# Patient Record
Sex: Female | Born: 1975 | Hispanic: No | Marital: Married | Smoking: Never smoker
Health system: Southern US, Community
[De-identification: ages and names within clinical notes are randomized; demographics above are authoritative.]

## PROBLEM LIST (undated history)

## (undated) DIAGNOSIS — Z8614 Personal history of Methicillin resistant Staphylococcus aureus infection: Secondary | ICD-10-CM

## (undated) DIAGNOSIS — R51 Headache: Secondary | ICD-10-CM

## (undated) DIAGNOSIS — R0989 Other specified symptoms and signs involving the circulatory and respiratory systems: Secondary | ICD-10-CM

## (undated) DIAGNOSIS — E039 Hypothyroidism, unspecified: Secondary | ICD-10-CM

## (undated) DIAGNOSIS — S90811A Abrasion, right foot, initial encounter: Secondary | ICD-10-CM

## (undated) DIAGNOSIS — R519 Headache, unspecified: Secondary | ICD-10-CM

## (undated) DIAGNOSIS — Z9109 Other allergy status, other than to drugs and biological substances: Secondary | ICD-10-CM

## (undated) DIAGNOSIS — N62 Hypertrophy of breast: Secondary | ICD-10-CM

## (undated) DIAGNOSIS — J45990 Exercise induced bronchospasm: Secondary | ICD-10-CM

## (undated) HISTORY — PX: FINGER GANGLION CYST EXCISION: SHX1636

## (undated) HISTORY — PX: TUBAL LIGATION: SHX77

## (undated) HISTORY — PX: ENDOMETRIAL ABLATION W/ NOVASURE: SUR434

---

## 2015-07-21 HISTORY — PX: INCONTINENCE SURGERY: SHX676

## 2015-07-21 HISTORY — PX: ANTERIOR AND POSTERIOR REPAIR: SHX1172

## 2016-06-19 DIAGNOSIS — Z8614 Personal history of Methicillin resistant Staphylococcus aureus infection: Secondary | ICD-10-CM

## 2016-06-19 HISTORY — DX: Personal history of Methicillin resistant Staphylococcus aureus infection: Z86.14

## 2017-05-20 DIAGNOSIS — N62 Hypertrophy of breast: Secondary | ICD-10-CM

## 2017-05-20 HISTORY — DX: Hypertrophy of breast: N62

## 2017-06-15 ENCOUNTER — Encounter (HOSPITAL_BASED_OUTPATIENT_CLINIC_OR_DEPARTMENT_OTHER): Payer: Self-pay | Admitting: *Deleted

## 2017-06-15 ENCOUNTER — Other Ambulatory Visit: Payer: Self-pay

## 2017-06-15 DIAGNOSIS — S90811A Abrasion, right foot, initial encounter: Secondary | ICD-10-CM

## 2017-06-15 DIAGNOSIS — R0989 Other specified symptoms and signs involving the circulatory and respiratory systems: Secondary | ICD-10-CM

## 2017-06-15 HISTORY — DX: Other specified symptoms and signs involving the circulatory and respiratory systems: R09.89

## 2017-06-15 HISTORY — DX: Abrasion, right foot, initial encounter: S90.811A

## 2017-06-17 ENCOUNTER — Ambulatory Visit: Payer: Self-pay | Admitting: Plastic Surgery

## 2017-06-22 ENCOUNTER — Other Ambulatory Visit: Payer: Self-pay

## 2017-06-22 ENCOUNTER — Ambulatory Visit (HOSPITAL_BASED_OUTPATIENT_CLINIC_OR_DEPARTMENT_OTHER): Payer: BLUE CROSS/BLUE SHIELD | Admitting: Certified Registered"

## 2017-06-22 ENCOUNTER — Encounter (HOSPITAL_BASED_OUTPATIENT_CLINIC_OR_DEPARTMENT_OTHER): Payer: Self-pay | Admitting: Certified Registered"

## 2017-06-22 ENCOUNTER — Ambulatory Visit (HOSPITAL_BASED_OUTPATIENT_CLINIC_OR_DEPARTMENT_OTHER)
Admission: RE | Admit: 2017-06-22 | Discharge: 2017-06-22 | Disposition: A | Discharge status: Home / Self Care | Payer: BLUE CROSS/BLUE SHIELD | Source: Ambulatory Visit | Attending: Plastic Surgery | Admitting: Plastic Surgery

## 2017-06-22 ENCOUNTER — Encounter (HOSPITAL_BASED_OUTPATIENT_CLINIC_OR_DEPARTMENT_OTHER)
Admission: RE | Disposition: A | Discharge status: Home / Self Care | Payer: Self-pay | Source: Ambulatory Visit | Attending: Plastic Surgery

## 2017-06-22 DIAGNOSIS — D241 Benign neoplasm of right breast: Secondary | ICD-10-CM | POA: Insufficient documentation

## 2017-06-22 DIAGNOSIS — N62 Hypertrophy of breast: Secondary | ICD-10-CM | POA: Diagnosis not present

## 2017-06-22 DIAGNOSIS — E039 Hypothyroidism, unspecified: Secondary | ICD-10-CM | POA: Diagnosis not present

## 2017-06-22 DIAGNOSIS — D242 Benign neoplasm of left breast: Secondary | ICD-10-CM | POA: Insufficient documentation

## 2017-06-22 DIAGNOSIS — Z6841 Body Mass Index (BMI) 40.0 and over, adult: Secondary | ICD-10-CM | POA: Diagnosis not present

## 2017-06-22 DIAGNOSIS — J3089 Other allergic rhinitis: Secondary | ICD-10-CM | POA: Diagnosis not present

## 2017-06-22 HISTORY — PX: BREAST REDUCTION SURGERY: SHX8

## 2017-06-22 HISTORY — DX: Abrasion, right foot, initial encounter: S90.811A

## 2017-06-22 HISTORY — DX: Other specified symptoms and signs involving the circulatory and respiratory systems: R09.89

## 2017-06-22 HISTORY — DX: Hypertrophy of breast: N62

## 2017-06-22 HISTORY — DX: Headache, unspecified: R51.9

## 2017-06-22 HISTORY — DX: Other allergy status, other than to drugs and biological substances: Z91.09

## 2017-06-22 HISTORY — DX: Personal history of Methicillin resistant Staphylococcus aureus infection: Z86.14

## 2017-06-22 HISTORY — DX: Exercise induced bronchospasm: J45.990

## 2017-06-22 HISTORY — DX: Hypothyroidism, unspecified: E03.9

## 2017-06-22 HISTORY — DX: Headache: R51

## 2017-06-22 SURGERY — MAMMOPLASTY, REDUCTION
Anesthesia: General | Site: Breast | Laterality: Bilateral

## 2017-06-22 MED ORDER — BUPIVACAINE LIPOSOME 1.3 % IJ SUSP
INTRAMUSCULAR | Status: DC | PRN
  Administered 2017-06-22: 50 mL

## 2017-06-22 MED ORDER — PROPOFOL 500 MG/50ML IV EMUL
INTRAVENOUS | Status: AC
  Filled 2017-06-22: qty 50

## 2017-06-22 MED ORDER — FENTANYL CITRATE (PF) 100 MCG/2ML IJ SOLN
50.0000 ug | INTRAMUSCULAR | Status: AC | PRN
  Administered 2017-06-22 (×2): 50 ug via INTRAVENOUS
  Administered 2017-06-22: 100 ug via INTRAVENOUS
  Administered 2017-06-22 (×3): 50 ug via INTRAVENOUS

## 2017-06-22 MED ORDER — ONDANSETRON HCL 4 MG/2ML IJ SOLN
INTRAMUSCULAR | Status: AC
  Filled 2017-06-22: qty 4

## 2017-06-22 MED ORDER — FENTANYL CITRATE (PF) 100 MCG/2ML IJ SOLN
INTRAMUSCULAR | Status: AC
  Filled 2017-06-22: qty 2

## 2017-06-22 MED ORDER — SCOPOLAMINE 1 MG/3DAYS TD PT72
1.0000 | MEDICATED_PATCH | Freq: Once | TRANSDERMAL | Status: DC | PRN
  Administered 2017-06-22: 1.5 mg via TRANSDERMAL

## 2017-06-22 MED ORDER — LIDOCAINE-EPINEPHRINE 1 %-1:100000 IJ SOLN
INTRAMUSCULAR | Status: AC
  Filled 2017-06-22: qty 1

## 2017-06-22 MED ORDER — DEXAMETHASONE SODIUM PHOSPHATE 10 MG/ML IJ SOLN
INTRAMUSCULAR | Status: AC
  Filled 2017-06-22: qty 1

## 2017-06-22 MED ORDER — MIDAZOLAM HCL 2 MG/2ML IJ SOLN
1.0000 mg | INTRAMUSCULAR | Status: DC | PRN
  Administered 2017-06-22: 2 mg via INTRAVENOUS

## 2017-06-22 MED ORDER — SODIUM CHLORIDE 0.9 % IJ SOLN
INTRAMUSCULAR | Status: DC | PRN
  Administered 2017-06-22: 60 mL

## 2017-06-22 MED ORDER — LACTATED RINGERS IV SOLN
INTRAVENOUS | Status: DC
  Administered 2017-06-22 (×3): via INTRAVENOUS

## 2017-06-22 MED ORDER — CHLORHEXIDINE GLUCONATE CLOTH 2 % EX PADS: 6.0000 | MEDICATED_PAD | Freq: Once | CUTANEOUS | Status: DC

## 2017-06-22 MED ORDER — LIDOCAINE HCL (CARDIAC) 20 MG/ML IV SOLN
INTRAVENOUS | Status: DC | PRN
  Administered 2017-06-22: 60 mg via INTRAVENOUS

## 2017-06-22 MED ORDER — BUPIVACAINE HCL (PF) 0.25 % IJ SOLN
INTRAMUSCULAR | Status: AC
  Filled 2017-06-22: qty 30

## 2017-06-22 MED ORDER — SUGAMMADEX SODIUM 200 MG/2ML IV SOLN
INTRAVENOUS | Status: DC | PRN
  Administered 2017-06-22: 250 mg via INTRAVENOUS

## 2017-06-22 MED ORDER — SCOPOLAMINE 1 MG/3DAYS TD PT72
MEDICATED_PATCH | TRANSDERMAL | Status: AC
  Filled 2017-06-22: qty 1

## 2017-06-22 MED ORDER — BACITRACIN ZINC 500 UNIT/GM EX OINT
TOPICAL_OINTMENT | CUTANEOUS | Status: AC
  Filled 2017-06-22: qty 28.35

## 2017-06-22 MED ORDER — ROCURONIUM BROMIDE 10 MG/ML (PF) SYRINGE
PREFILLED_SYRINGE | INTRAVENOUS | Status: AC
  Filled 2017-06-22: qty 10

## 2017-06-22 MED ORDER — DEXAMETHASONE SODIUM PHOSPHATE 4 MG/ML IJ SOLN
INTRAMUSCULAR | Status: DC | PRN
  Administered 2017-06-22: 10 mg via INTRAVENOUS

## 2017-06-22 MED ORDER — OXYCODONE HCL 5 MG/5ML PO SOLN: 5.0000 mg | Freq: Once | ORAL | Status: DC | PRN

## 2017-06-22 MED ORDER — HYDROMORPHONE HCL 1 MG/ML IJ SOLN
0.2500 mg | INTRAMUSCULAR | Status: DC | PRN
  Administered 2017-06-22 (×3): 0.5 mg via INTRAVENOUS

## 2017-06-22 MED ORDER — SUGAMMADEX SODIUM 500 MG/5ML IV SOLN
INTRAVENOUS | Status: AC
  Filled 2017-06-22: qty 5

## 2017-06-22 MED ORDER — OXYCODONE HCL 5 MG PO TABS: 5.0000 mg | ORAL_TABLET | Freq: Once | ORAL | Status: DC | PRN

## 2017-06-22 MED ORDER — BUPIVACAINE HCL (PF) 0.5 % IJ SOLN
INTRAMUSCULAR | Status: AC
  Filled 2017-06-22: qty 30

## 2017-06-22 MED ORDER — ONDANSETRON HCL 4 MG/2ML IJ SOLN: 4.0000 mg | Freq: Four times a day (QID) | INTRAMUSCULAR | Status: DC | PRN

## 2017-06-22 MED ORDER — HYDROMORPHONE HCL 1 MG/ML IJ SOLN
INTRAMUSCULAR | Status: AC
  Filled 2017-06-22: qty 0.5

## 2017-06-22 MED ORDER — ONDANSETRON HCL 4 MG/2ML IJ SOLN
INTRAMUSCULAR | Status: DC | PRN
  Administered 2017-06-22: 4 mg via INTRAVENOUS

## 2017-06-22 MED ORDER — SODIUM CHLORIDE 0.9 % IJ SOLN
INTRAMUSCULAR | Status: DC | PRN
  Administered 2017-06-22: 50 mL

## 2017-06-22 MED ORDER — BACITRACIN ZINC 500 UNIT/GM EX OINT
TOPICAL_OINTMENT | CUTANEOUS | Status: DC | PRN
  Administered 2017-06-22: 1 via TOPICAL

## 2017-06-22 MED ORDER — PROPOFOL 10 MG/ML IV BOLUS
INTRAVENOUS | Status: DC | PRN
  Administered 2017-06-22: 200 mg via INTRAVENOUS

## 2017-06-22 MED ORDER — CIPROFLOXACIN IN D5W 400 MG/200ML IV SOLN
400.0000 mg | INTRAVENOUS | Status: AC
  Administered 2017-06-22: 400 mg via INTRAVENOUS

## 2017-06-22 MED ORDER — ROCURONIUM BROMIDE 100 MG/10ML IV SOLN
INTRAVENOUS | Status: DC | PRN
  Administered 2017-06-22: 20 mg via INTRAVENOUS
  Administered 2017-06-22: 60 mg via INTRAVENOUS

## 2017-06-22 MED ORDER — MIDAZOLAM HCL 2 MG/2ML IJ SOLN
INTRAMUSCULAR | Status: AC
  Filled 2017-06-22: qty 2

## 2017-06-22 MED ORDER — CIPROFLOXACIN IN D5W 400 MG/200ML IV SOLN
INTRAVENOUS | Status: AC
  Filled 2017-06-22: qty 200

## 2017-06-22 MED ORDER — BUPIVACAINE LIPOSOME 1.3 % IJ SUSP
INTRAMUSCULAR | Status: AC
Start: 2017-06-22 — End: 2017-06-22
  Filled 2017-06-22: qty 20

## 2017-06-22 SURGICAL SUPPLY — 60 items
BAG DECANTER FOR FLEXI CONT (MISCELLANEOUS) ×3 IMPLANT
BENZOIN TINCTURE PRP APPL 2/3 (GAUZE/BANDAGES/DRESSINGS) ×6 IMPLANT
BLADE KNIFE PERSONA 10 (BLADE) ×12 IMPLANT
BLADE KNIFE PERSONA 15 (BLADE) ×9 IMPLANT
BNDG GAUZE ELAST 4 BULKY (GAUZE/BANDAGES/DRESSINGS) ×6 IMPLANT
CANISTER SUCT 1200ML W/VALVE (MISCELLANEOUS) ×3 IMPLANT
CAP BOUFFANT 24 BLUE NURSES (PROTECTIVE WEAR) ×3 IMPLANT
CLOSURE WOUND 1/2 X4 (GAUZE/BANDAGES/DRESSINGS) ×4
COVER BACK TABLE 60X90IN (DRAPES) ×3 IMPLANT
COVER MAYO STAND STRL (DRAPES) ×3 IMPLANT
DECANTER SPIKE VIAL GLASS SM (MISCELLANEOUS) ×6 IMPLANT
DRAIN CHANNEL 10F 3/8 F FF (DRAIN) ×6 IMPLANT
DRAPE LAPAROSCOPIC ABDOMINAL (DRAPES) IMPLANT
DRAPE U-SHAPE 76X120 STRL (DRAPES) ×6 IMPLANT
DRSG EMULSION OIL 3X3 NADH (GAUZE/BANDAGES/DRESSINGS) ×6 IMPLANT
DRSG PAD ABDOMINAL 8X10 ST (GAUZE/BANDAGES/DRESSINGS) ×6 IMPLANT
ELECT REM PT RETURN 9FT ADLT (ELECTROSURGICAL) ×3
ELECTRODE REM PT RTRN 9FT ADLT (ELECTROSURGICAL) ×1 IMPLANT
EVACUATOR SILICONE 100CC (DRAIN) ×6 IMPLANT
FILTER 7/8 IN (FILTER) IMPLANT
GAUZE SPONGE 4X4 12PLY STRL (GAUZE/BANDAGES/DRESSINGS) ×6 IMPLANT
GLOVE BIO SURGEON STRL SZ7 (GLOVE) ×3 IMPLANT
GOWN STRL REUS W/ TWL LRG LVL3 (GOWN DISPOSABLE) ×2 IMPLANT
GOWN STRL REUS W/TWL LRG LVL3 (GOWN DISPOSABLE) ×4
IV NS 250ML (IV SOLUTION) ×2
IV NS 250ML BAXH (IV SOLUTION) ×1 IMPLANT
NDL SAFETY ECLIPSE 18X1.5 (NEEDLE) ×1 IMPLANT
NEEDLE HYPO 18GX1.5 SHARP (NEEDLE) ×2
NEEDLE HYPO 25X1 1.5 SAFETY (NEEDLE) ×9 IMPLANT
NEEDLE SPNL 18GX3.5 QUINCKE PK (NEEDLE) ×3 IMPLANT
NS IRRIG 1000ML POUR BTL (IV SOLUTION) ×6 IMPLANT
PACK BASIN DAY SURGERY FS (CUSTOM PROCEDURE TRAY) ×3 IMPLANT
PIN SAFETY STERILE (MISCELLANEOUS) ×3 IMPLANT
SCRUB TECHNI CARE 4 OZ NO DYE (MISCELLANEOUS) ×3 IMPLANT
SLEEVE SCD COMPRESS KNEE MED (MISCELLANEOUS) ×3 IMPLANT
SPECIMEN JAR MEDIUM (MISCELLANEOUS) IMPLANT
SPECIMEN JAR X LARGE (MISCELLANEOUS) ×6 IMPLANT
SPONGE LAP 18X18 X RAY DECT (DISPOSABLE) ×9 IMPLANT
STAPLER VISISTAT 35W (STAPLE) ×6 IMPLANT
STRIP CLOSURE SKIN 1/2X4 (GAUZE/BANDAGES/DRESSINGS) ×8 IMPLANT
SUT ETHILON 3 0 PS 1 (SUTURE) ×3 IMPLANT
SUT MNCRL AB 3-0 PS2 18 (SUTURE) IMPLANT
SUT MNCRL AB 4-0 PS2 18 (SUTURE) ×6 IMPLANT
SUT MON AB 5-0 PS2 18 (SUTURE) ×6 IMPLANT
SUT PROLENE 2 0 CT2 30 (SUTURE) ×3 IMPLANT
SUT PROLENE 3 0 PS 1 (SUTURE) ×6 IMPLANT
SUT VLOC 90 P-14 23 (SUTURE) ×6 IMPLANT
SYR BULB IRRIGATION 50ML (SYRINGE) ×6 IMPLANT
SYR CONTROL 10ML LL (SYRINGE) ×6 IMPLANT
TAPE MEASURE VINYL STERILE (MISCELLANEOUS) ×3 IMPLANT
TOWEL OR 17X24 6PK STRL BLUE (TOWEL DISPOSABLE) ×9 IMPLANT
TOWEL OR NON WOVEN STRL DISP B (DISPOSABLE) IMPLANT
TRAY DSU PREP LF (CUSTOM PROCEDURE TRAY) ×3 IMPLANT
TRAY FOLEY BAG SILVER LF 14FR (SET/KITS/TRAYS/PACK) IMPLANT
TRAY FOLEY BAG SILVER LF 16FR (SET/KITS/TRAYS/PACK) ×3 IMPLANT
TUBE CONNECTING 20'X1/4 (TUBING) ×1
TUBE CONNECTING 20X1/4 (TUBING) ×2 IMPLANT
UNDERPAD 30X30 (UNDERPADS AND DIAPERS) ×6 IMPLANT
VAC PENCILS W/TUBING CLEAR (MISCELLANEOUS) ×3 IMPLANT
YANKAUER SUCT BULB TIP NO VENT (SUCTIONS) ×3 IMPLANT

## 2017-06-22 NOTE — Anesthesia Procedure Notes (Signed)
Procedure Name: Intubation Date/Time: 06/22/2017 7:52 AM Performed by: Signe Colt, CRNA Pre-anesthesia Checklist: Patient identified, Emergency Drugs available, Suction available and Patient being monitored Patient Re-evaluated:Patient Re-evaluated prior to induction Oxygen Delivery Method: Circle system utilized Preoxygenation: Pre-oxygenation with 100% oxygen Induction Type: IV induction Ventilation: Mask ventilation without difficulty Laryngoscope Size: Mac and 3 Tube type: Oral Number of attempts: 1 Airway Equipment and Method: Stylet and Oral airway Placement Confirmation: ETT inserted through vocal cords under direct vision,  positive ETCO2 and breath sounds checked- equal and bilateral Tube secured with: Tape Dental Injury: Teeth and Oropharynx as per pre-operative assessment

## 2017-06-22 NOTE — Discharge Instructions (Signed)
1. No lifting greater than 5 lbs with arms for 4 weeks. 2. Empty, strip, record and reactivate JP drains 3 times a day. 3. Percocet 5/325 mg tabs 1-2 tabs po q 4-6 hours prn pain- prescription given in office. 4. Duricef 1 tab po bid- prescription given in office. 5. Sterapred dose pack as directed- prescription given in office. 6. Follow-up appointment Friday in office.        JP Drain [employer] this sheet to all of your post-operative appointments while you have your drains.  Please measure your drains by CC's or ML's.  Make sure you drain and measure your JP Drains 2 or 3 times per day.  At the end of each day, add up totals for the left side and add up totals for the right side.    ( 9 am )     ( 3 pm )        ( 9 pm )                Date L  R  L  R  L  R  Total L/R                                                                                                                                                                                             Information for Discharge Teaching: EXPAREL (bupivacaine liposome injectable suspension)   Your surgeon gave you EXPAREL(bupivacaine) in your surgical incision to help control your pain after surgery.   EXPAREL is a local anesthetic that provides pain relief by numbing the tissue around the surgical site.  EXPAREL is designed to release pain medication over time and can control pain for up to 72 hours.  Depending on how you respond to EXPAREL, you may require less pain medication during your recovery.  Possible side effects:  Temporary loss of sensation or ability to move in the area where bupivacaine was injected.  Nausea, vomiting, constipation  Rarely, numbness and tingling in your mouth or lips, lightheadedness, or anxiety may occur.  Call your doctor right away if you think you may be experiencing any of these sensations, or if you have other questions regarding possible side effects.  Follow  all other discharge instructions given to you by your surgeon or nurse. Eat a healthy diet and drink plenty of water or other fluids.  If you return to the hospital for any reason within 96 hours following the administration of EXPAREL, please inform your health care providers.       Post Anesthesia Home Care Instructions  Activity: Get plenty of rest for  the remainder of the day. A responsible individual must stay with you for 24 hours following the procedure.  For the next 24 hours, DO NOT: -Drive a car -Paediatric nurse -Drink alcoholic beverages -Take any medication unless instructed by your physician -Make any legal decisions or sign important papers.  Meals: Start with liquid foods such as gelatin or soup. Progress to regular foods as tolerated. Avoid greasy, spicy, heavy foods. If nausea and/or vomiting occur, drink only clear liquids until the nausea and/or vomiting subsides. Call your physician if vomiting continues.  Special Instructions/Symptoms: Your throat may feel dry or sore from the anesthesia or the breathing tube placed in your throat during surgery. If this causes discomfort, gargle with warm salt water. The discomfort should disappear within 24 hours.  If you had a scopolamine patch placed behind your ear for the management of post- operative nausea and/or vomiting:  1. The medication in the patch is effective for 72 hours, after which it should be removed.  Wrap patch in a tissue and discard in the trash. Wash hands thoroughly with soap and water. 2. You may remove the patch earlier than 72 hours if you experience unpleasant side effects which may include dry mouth, dizziness or visual disturbances. 3. Avoid touching the patch. Wash your hands with soap and water after contact with the patch.

## 2017-06-22 NOTE — Anesthesia Postprocedure Evaluation (Signed)
Anesthesia Post Note  Patient: Loretta Joseph  Procedure(s) Performed: BILATERAL MAMMARY REDUCTION  (BREAST) (Bilateral Breast)     Patient location during evaluation: PACU Anesthesia Type: General Level of consciousness: awake and alert Pain management: pain level controlled Vital Signs Assessment: post-procedure vital signs reviewed and stable Respiratory status: spontaneous breathing, nonlabored ventilation, respiratory function stable and patient connected to nasal cannula oxygen Cardiovascular status: blood pressure returned to baseline and stable Postop Assessment: no apparent nausea or vomiting Anesthetic complications: no    Last Vitals:  Vitals:   06/22/17 1415 06/22/17 1515  BP: (!) 99/50 (!) 114/58  Pulse: 93 94  Resp: 18 18  Temp:  (!) 36.4 C  SpO2: 95% 95%    Last Pain:  Vitals:   06/22/17 1515  TempSrc:   PainSc: 3                  Tiajuana Amass

## 2017-06-22 NOTE — H&P (Signed)
  H&P faxed to surgical center.  -History and Physical Reviewed  -Patient has been re-examined  -No change in the plan of care  CONTOGIANNIS,MARY A    

## 2017-06-22 NOTE — Brief Op Note (Signed)
06/22/2017  12:38 PM  PATIENT:  Loretta Joseph  41 y.o. female  PRE-OPERATIVE DIAGNOSIS:  BILATERAL MACROMASTIA  POST-OPERATIVE DIAGNOSIS:  BILATERAL MACROMASTIA  PROCEDURE:  Procedure(s): BILATERAL MAMMARY REDUCTION  (BREAST) (Bilateral)  SURGEON:  Surgeon(s) and Role:    * Contogiannis, Audrea Muscat, MD - Primary  ANESTHESIA:   general  EBL:  150 mL   BLOOD ADMINISTERED:none  DRAINS: (70F) Jackson-Pratt drain(s) with closed bulb suction in the Bilateral Breasts   LOCAL MEDICATIONS USED:  1.3% Exparel (266 mgs. Total)  SPECIMEN:  Source of Specimen:  Bilateral Breasts  DISPOSITION OF SPECIMEN:  PATHOLOGY  COUNTS:  YES  DICTATION: .Note written in EPIC  PLAN OF CARE: Discharge to home after PACU  PATIENT DISPOSITION:  PACU - hemodynamically stable.   Delay start of Pharmacological VTE agent (>24hrs) due to surgical blood loss or risk of bleeding: not applicable

## 2017-06-22 NOTE — Transfer of Care (Signed)
Immediate Anesthesia Transfer of Care Note  Patient: Loretta Joseph  Procedure(s) Performed: BILATERAL MAMMARY REDUCTION  (BREAST) (Bilateral Breast)  Patient Location: PACU  Anesthesia Type:General  Level of Consciousness: awake and patient cooperative  Airway & Oxygen Therapy: Patient Spontanous Breathing and Patient connected to face mask oxygen  Post-op Assessment: Report given to RN and Post -op Vital signs reviewed and stable  Post vital signs: Reviewed and stable  Last Vitals:  Vitals:   06/22/17 0714  BP: (!) 115/52  Pulse: 80  Resp: 18  Temp: 36.7 C  SpO2: 97%    Last Pain:  Vitals:   06/22/17 0714  TempSrc: Oral         Complications: No apparent anesthesia complications

## 2017-06-22 NOTE — Op Note (Signed)
OPERATIVE REPORT  06/22/2017  Loretta Joseph  PREOPERATIVE DIAGNOSIS:  Bilateral macromastia.  POSTOPERATIVE DIAGNOSIS:  Bilateral macromastia.  PROCEDURE:  Bilateral reduction mammoplasties.  ATTENDING SURGEON:  Youlanda Roys, MD  ANESTHESIA:  General.  ANESTHESIOLOGIST:  , MD  COMPLICATIONS:  None.  INDICATIONS FOR THE PROCEDURE:  The patient is a 42 y.o. female who has bilateral macromastia that is clinically symptomatic.  She presents to undergo bilateral reduction mammoplasties.  DESCRIPTION OF PROCEDURE:  The patient was marked in preop holding area in a pattern of Wise for the future bilateral reduction mammoplasties. She was then taken back to the OR, placed on the table in supine position.  After adequate general anesthesia was obtained, the patient's chest was prepped with Techni-Care and draped in sterile fashion.  The bases of the breasts have been infiltrated with 1% lidocaine with epinephrine.  After adequate hemostasis and anesthesia taken effect, the procedure was begun.  Both of the breast reductions were performed in the following similar manner.  The nipple-areolar complex was marked with a 45-mm nipple marker.  The skin was then incised and deepithelialized around the nipple-areolar complex down to the inframammary crease in the inferior pedicle pattern.  Next, the medial, superior, and lateral skin flaps were elevated down to the chest wall.  Excess fat and glandular tissue removed from the inferior pedicle.  The nipple-areolar complex was examined and found to be pink and viable.  The wound was irrigated with saline irrigation.  Meticulous hemostasis was obtained with the Bovie electrocautery.  Inferior pedicle was centralized using 3-0 Prolene suture.  A #10 JP flat fully fluted drain was placed into the wound. The skin flaps were brought together at the inverted T junction with a 2- 0 Prolene suture.  The incisions were stapled for temporary  closure. The breasts compared and found to have good shape and symmetry.  The incisions were then closed from the medial aspect of the JP drain to the medial aspect of the [facility] incision by first placing a few 3-0 Monocryl sutures to tack together the dermal layer, and then both the dermal and cuticular layer were closed in a single layer using a 2-0 Quill PDO barbed suture.  Lateral to the JP drain incision was closed using 3-0 Monocryl in the dermal layer, followed by 3-0 Monocryl running intracuticular stitch on the skin.  The vertical limb of the Wise pattern was closed in the dermal layer using 3-0 Monocryl suture.  The patient was placed in the upright position.  The future location of the nipple-areolar complexes was marked on both breast mounds using the 42-mm nipple marker.  She was then placed back in the recumbent position.  Both of the nipple areolar complexes were brought out onto the breast mounds in the following similar manner.  The skin was incised as marked and removed in full thickness into the subcutaneous tissues.  The nipple- areolar complex was examined, found to be pink and viable, then brought out through this aperture and sewn in place using 4-0 Monocryl in the dermal layer, followed by 5-0 Monocryl running intracuticular stitch on the skin.  This 5-0 Monocryl suture was then brought down to close the cuticular layer of the vertical limb as well.  The JP drain was sewn in place using 3-0 nylon suture.  The pectoralis major muscle and fascia along with the breast and chest soft tissues were then infiltrated with 1% Exparel (total 266 mg).  Now the [facility] incision was also infiltrated with  the Exparel in order to give the patient postoperative pain control.  The incisions were dressed with benzoin, Steri-Strips, and the nipples dressed with bacitracin ointment and Adaptic.  4x4s were placed over the incisions and ABD pads in the axillary areas.  The patient  was placed into a light postoperative support bra.  There were no complications. The patient tolerated the procedure well.  The final needle, sponge counts were reported to be correct at the end of the case.  The patient was then recovered without complications.  Both the patient and her family were given proper postoperative wound care instructions. She was then discharged home in the care of her family in stable condition.  Follow up will be with me in a few days in the office.         Youlanda Roys, M.D.  06/22/2017 12:44 PM      OPERATIVE REPORT  06/22/2017  Loretta Joseph  PREOPERATIVE DIAGNOSIS:  Bilateral macromastia.  POSTOPERATIVE DIAGNOSIS:  Bilateral macromastia.  PROCEDURE:  Bilateral reduction mammoplasties.  ATTENDING SURGEON:  Youlanda Roys, MD  ANESTHESIA:  General.  ANESTHESIOLOGIST:  , MD  COMPLICATIONS:  None.  INDICATIONS FOR THE PROCEDURE:  The patient is a 41 y.o. female who has bilateral macromastia that is clinically symptomatic.  She presents to undergo bilateral reduction mammoplasties.  DESCRIPTION OF PROCEDURE:  The patient was marked in preop holding area in a pattern of Wise for the future bilateral reduction mammoplasties. She was then taken back to the OR, placed on the table in supine position.  After adequate general anesthesia was obtained, the patient's chest was prepped with Techni-Care and draped in sterile fashion.  The bases of the breasts have been infiltrated with 1% lidocaine with epinephrine.  After adequate hemostasis and anesthesia taken effect, the procedure was begun.  Both of the breast reductions were performed in the following similar manner.  The nipple-areolar complex was marked with a 45-mm nipple marker.  The skin was then incised and deepithelialized around the nipple-areolar complex down to the inframammary crease in the inferior pedicle pattern.  Next, the medial, superior, and lateral  skin flaps were elevated down to the chest wall.  Excess fat and glandular tissue removed from the inferior pedicle.  The nipple-areolar complex was examined and found to be pink and viable.  The wound was irrigated with saline irrigation.  Meticulous hemostasis was obtained with the Bovie electrocautery.  Inferior pedicle was centralized using 3-0 Prolene suture.  A #10 JP flat fully fluted drain was placed into the wound. The skin flaps were brought together at the inverted T junction with a 2- 0 Prolene suture.  The incisions were stapled for temporary closure. The breasts compared and found to have good shape and symmetry.  The incisions were then closed from the medial aspect of the JP drain to the medial aspect of the [facility] incision by first placing a few 3-0 Monocryl sutures to tack together the dermal layer, and then both the dermal and cuticular layer were closed in a single layer using a 2-0 Quill PDO barbed suture.  Lateral to the JP drain incision was closed using 3-0 Monocryl in the dermal layer, followed by 3-0 Monocryl running intracuticular stitch on the skin.  The vertical limb of the Wise pattern was closed in the dermal layer using 3-0 Monocryl suture.  The patient was placed in the upright position.  The future location of the nipple-areolar complexes was marked on both breast mounds using the  45-mm nipple marker.  She was then placed back in the recumbent position.  Both of the nipple areolar complexes were brought out onto the breast mounds in the following similar manner.  The skin was incised as marked and removed in full thickness into the subcutaneous tissues.  The nipple- areolar complex was examined, found to be pink and viable, then brought out through this aperture and sewn in place using 4-0 Monocryl in the dermal layer, followed by 5-0 Monocryl running intracuticular stitch on the skin.  This 5-0 Monocryl suture was then brought down to close the cuticular  layer of the vertical limb as well.  The JP drain was sewn in place using 3-0 nylon suture.  The pectoralis major muscle and fascia along with the breast and chest soft tissues were then infiltrated with 1% Exparel (total 266 mg).  Now the [facility] incision was also infiltrated with the Exparel in order to give the patient postoperative pain control.  The incisions were dressed with benzoin, Steri-Strips, and the nipples dressed with bacitracin ointment and Adaptic.  4x4s were placed over the incisions and ABD pads in the axillary areas.  The patient was placed into a light postoperative support bra.  There were no complications. The patient tolerated the procedure well.  The final needle, sponge counts were reported to be correct at the end of the case.  The patient was then recovered without complications.  Both the patient and her husband were given proper postoperative wound care instructions. She was then discharged home in the care of her husband in stable condition.  Follow up will be with me in a few days in the office.         Youlanda Roys, M.D.  06/22/2017 12:44 PM

## 2017-06-22 NOTE — Anesthesia Preprocedure Evaluation (Signed)
Anesthesia Evaluation  Patient identified by MRN, date of birth, ID band Patient awake    Reviewed: Allergy & Precautions, H&P , NPO status , Patient's Chart, lab work & pertinent test results  Airway Mallampati: II   Neck ROM: full    Dental   Pulmonary asthma ,    breath sounds clear to auscultation       Cardiovascular negative cardio ROS   Rhythm:regular Rate:Normal     Neuro/Psych  Headaches,    GI/Hepatic   Endo/Other  Hypothyroidism Morbid obesity  Renal/GU      Musculoskeletal   Abdominal   Peds  Hematology   Anesthesia Other Findings   Reproductive/Obstetrics                             Anesthesia Physical Anesthesia Plan  ASA: II  Anesthesia Plan: General   Post-op Pain Management:    Induction: Intravenous  PONV Risk Score and Plan: 3 and Ondansetron, Dexamethasone, Midazolam and Treatment may vary due to age or medical condition  Airway Management Planned: Oral ETT  Additional Equipment:   Intra-op Plan:   Post-operative Plan: Extubation in OR  Informed Consent: I have reviewed the patients History and Physical, chart, labs and discussed the procedure including the risks, benefits and alternatives for the proposed anesthesia with the patient or authorized representative who has indicated his/her understanding and acceptance.     Plan Discussed with: CRNA, Anesthesiologist and Surgeon  Anesthesia Plan Comments:         Anesthesia Quick Evaluation

## 2017-06-23 ENCOUNTER — Encounter (HOSPITAL_BASED_OUTPATIENT_CLINIC_OR_DEPARTMENT_OTHER): Payer: Self-pay | Admitting: Plastic Surgery

## 2017-07-10 ENCOUNTER — Other Ambulatory Visit: Payer: Self-pay

## 2017-07-10 ENCOUNTER — Emergency Department (INDEPENDENT_AMBULATORY_CARE_PROVIDER_SITE_OTHER)
Admission: EM | Admit: 2017-07-10 | Discharge: 2017-07-10 | Disposition: A | Discharge status: Home / Self Care | Payer: BLUE CROSS/BLUE SHIELD | Source: Home / Self Care

## 2017-07-10 ENCOUNTER — Emergency Department (INDEPENDENT_AMBULATORY_CARE_PROVIDER_SITE_OTHER): Payer: BLUE CROSS/BLUE SHIELD

## 2017-07-10 ENCOUNTER — Encounter: Payer: Self-pay | Admitting: Emergency Medicine

## 2017-07-10 DIAGNOSIS — R059 Cough, unspecified: Secondary | ICD-10-CM

## 2017-07-10 DIAGNOSIS — R05 Cough: Secondary | ICD-10-CM

## 2017-07-10 DIAGNOSIS — J069 Acute upper respiratory infection, unspecified: Secondary | ICD-10-CM

## 2017-07-10 DIAGNOSIS — B9789 Other viral agents as the cause of diseases classified elsewhere: Secondary | ICD-10-CM

## 2017-07-10 DIAGNOSIS — R0602 Shortness of breath: Secondary | ICD-10-CM | POA: Diagnosis not present

## 2017-07-10 MED ORDER — BENZONATATE 100 MG PO CAPS: 100.0000 mg | ORAL_CAPSULE | Freq: Three times a day (TID) | ORAL | 0 refills | Status: DC

## 2017-07-10 MED ORDER — PREDNISONE 10 MG PO TABS: ORAL_TABLET | ORAL | 0 refills | Status: DC

## 2017-07-10 MED ORDER — ALBUTEROL SULFATE (2.5 MG/3ML) 0.083% IN NEBU: 2.5000 mg | INHALATION_SOLUTION | Freq: Four times a day (QID) | RESPIRATORY_TRACT | 12 refills | Status: AC | PRN

## 2017-07-10 NOTE — ED Provider Notes (Signed)
[name]    CSN: [id number] Arrival date & time: 07/10/17  1437     History   Chief Complaint Chief Complaint  Patient presents with  . Cough    HPI Loretta Joseph is a 41 y.o. female.   The history is provided by the patient. No language interpreter was used.  Cough  Cough characteristics:  Non-productive Severity:  Moderate Onset quality:  Gradual Timing:  Intermittent Progression:  Worsening Chronicity:  New Smoker: no   Relieved by:  Beta-agonist inhaler Worsened by:  Nothing Ineffective treatments:  None tried Pt complains of a cough since surgery on Dec 4.  Pt reports improved with albuterol.  Pt is requesting chest xray to make sure she does not have pneumonia  Past Medical History:  Diagnosis Date  . Abrasion of foot, right 06/15/2017  . Environmental allergies   . Exercise-induced asthma    prn inhaler  . History of MRSA infection 06/2016   axilla  . Hypothyroidism   . 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 Runny nose 06/15/2017   clear drainage, per pt.  . Sinus headache     There are no active problems to display for this patient.   Past Surgical History:  Procedure Laterality Date  . ANTERIOR AND POSTERIOR REPAIR  2017  . BREAST REDUCTION SURGERY Bilateral 06/22/2017   Procedure: BILATERAL MAMMARY REDUCTION  (BREAST);  Surgeon: Contogiannis, Audrea Muscat, MD;  Location: [facility];  Service: Plastics;  Laterality: Bilateral;  . ENDOMETRIAL ABLATION W/ NOVASURE    . FINGER GANGLION CYST EXCISION Left    index finger  . INCONTINENCE SURGERY  2017  . TUBAL LIGATION      OB History    No data available       Home Medications    Prior to Admission medications   Medication Sig Start Date End Date Taking? Authorizing Provider  albuterol (PROVENTIL) (2.5 MG/3ML) 0.083% nebulizer solution Take 3 mLs (2.5 mg total) by nebulization every 6 (six) hours as needed for wheezing or shortness of breath. 07/10/17   Fransico Meadow, PA-C    benzonatate (TESSALON) 100 MG capsule Take 1 capsule (100 mg total) by mouth every 8 (eight) hours. 07/10/17   Fransico Meadow, PA-C  cetirizine (ZYRTEC) 10 MG tablet Take 20 mg by mouth at bedtime.    [provider]  cholecalciferol (VITAMIN D) 1000 units tablet Take 1,000 Units by mouth daily.    [provider]  levothyroxine (SYNTHROID, LEVOTHROID) 125 MCG tablet Take 25 mcg by mouth daily before breakfast.     [provider]  loratadine (CLARITIN) 10 MG tablet Take 10 mg by mouth daily.    [provider]  Multiple Vitamin (MULTIVITAMIN) tablet Take 1 tablet by mouth daily.    [provider]  predniSONE (DELTASONE) 10 MG tablet 6,5,4,3,2,1 taper 07/10/17   Fransico Meadow, PA-C  Pregnenolone Micronized POWD Take 75 mg by mouth.     [provider]  Probiotic Product (PROBIOTIC DAILY PO) Take by mouth.    [provider]  progesterone (PROMETRIUM) 100 MG capsule Take 100 mg by mouth daily.    [provider]  thyroid (ARMOUR) 120 MG tablet Take 75 mg by mouth daily before breakfast.     [provider]  vitamin C (ASCORBIC ACID) 500 MG tablet Take 500 mg by mouth daily.    [provider]    Family History History reviewed. No pertinent family history.  Social History Social  History   Tobacco Use  . Smoking status: Never Smoker  . Smokeless tobacco: Never Used  Substance Use Topics  . Alcohol use: Yes    Comment: infrequently  . Drug use: No     Allergies   Amoxicillin; Norco [hydrocodone-acetaminophen]; and Adhesive [tape]   Review of Systems Review of Systems  Respiratory: Positive for cough.   All other systems reviewed and are negative.    Physical Exam Triage Vital Signs ED Triage Vitals  Enc Vitals Group     BP 07/10/17 1449 110/73     Pulse Rate 07/10/17 1449 87     Resp 07/10/17 1449 16     Temp 07/10/17 1449 98.2 F (36.8 C)     Temp Source 07/10/17 1449 Oral      SpO2 07/10/17 1449 97 %     Weight 07/10/17 1450 237 lb (107.5 kg)     Height 07/10/17 1450 5\' 2"  (1.575 m)     Head Circumference --      Peak Flow --      Pain Score 07/10/17 1450 0     Pain Loc --      Pain Edu? --      Excl. in [place]? --    No data found.  Updated Vital Signs BP 110/73 (BP Location: Left Arm)   Pulse 87   Temp 98.2 F (36.8 C) (Oral)   Resp 16   Ht 5\' 2"  (1.575 m)   Wt 237 lb (107.5 kg)   SpO2 97%   BMI 43.35 kg/m   Visual Acuity Right Eye Distance:   Left Eye Distance:   Bilateral Distance:    Right Eye Near:   Left Eye Near:    Bilateral Near:     Physical Exam  Constitutional: She appears well-developed and well-nourished. No distress.  HENT:  Head: Normocephalic and atraumatic.  Right Ear: External ear normal.  Left Ear: External ear normal.  Nose: Nose normal.  Mouth/Throat: Oropharynx is clear and moist.  Eyes: Conjunctivae are normal.  Neck: Neck supple.  Cardiovascular: Normal rate and regular rhythm.  No murmur heard. Pulmonary/Chest: Effort normal and breath sounds normal. No respiratory distress.  Abdominal: Soft. There is no tenderness.  Musculoskeletal: She exhibits no edema.  Neurological: She is alert.  Skin: Skin is warm and dry.  Psychiatric: She has a normal mood and affect.  Nursing note and vitals reviewed.    UC Treatments / Results  Labs (all labs ordered are listed, but only abnormal results are displayed) Labs Reviewed - No data to display  EKG  EKG Interpretation None       Radiology Dg Chest 2 View  Result Date: 07/10/2017 CLINICAL DATA:  Cough, shortness of breath and chest heaviness since breast reduction surgery 06/22/2017. EXAM: CHEST  2 VIEW COMPARISON:  None. FINDINGS: Lungs are clear. Heart size is normal. No pneumothorax or pleural effusion. No acute bony abnormality. IMPRESSION: Negative chest. Electronically Signed   By: Inge Rise M.D.   On: 07/10/2017 15:21     Procedures Procedures (including critical care time)  Medications Ordered in UC Medications - No data to display   Initial Impression / Assessment and Plan / UC Course  I have reviewed the triage vital signs and the nursing notes.  Pertinent labs & imaging results that were available during my care of the patient were reviewed by me and considered in my medical decision making (see chart for details).     Chest xray normal,  No pneumonia,  18 days post op,  I doubt PE.  I suspect viral illness,  I counseled pt on symptoms and treatment  Final Clinical Impressions(s) / UC Diagnoses   Final diagnoses:  Cough  Viral URI with cough    ED Discharge Orders        Ordered    predniSONE (DELTASONE) 10 MG tablet     07/10/17 1541    albuterol (PROVENTIL) (2.5 MG/3ML) 0.083% nebulizer solution  Every 6 hours PRN     07/10/17 1541    benzonatate (TESSALON) 100 MG capsule  Every 8 hours     07/10/17 1541     An After Visit Summary was printed and given to the patient.   Controlled Substance Prescriptions [place] Controlled Substance Registry consulted? Not Applicable   Fransico Meadow, [place] 07/10/17 1728

## 2017-07-10 NOTE — Discharge Instructions (Signed)
See your Physicain for recheck in 1 week if symptoms persist

## 2017-07-10 NOTE — ED Triage Notes (Signed)
Cough and Chest Discomfort x 1 week. Patient is requesting chest xray.

## 2017-07-12 ENCOUNTER — Telehealth: Payer: Self-pay

## 2017-07-12 NOTE — Telephone Encounter (Signed)
Feeling better, will follow up as needed.

## 2018-10-22 IMAGING — DX DG CHEST 2V
2 series · 2 of 2 positions shown · non-contrast
Comparison: None.

CLINICAL DATA: Cough, shortness of breath and chest heaviness since
breast reduction surgery 06/22/2017.

EXAM:
CHEST  2 VIEW

[chest pa]
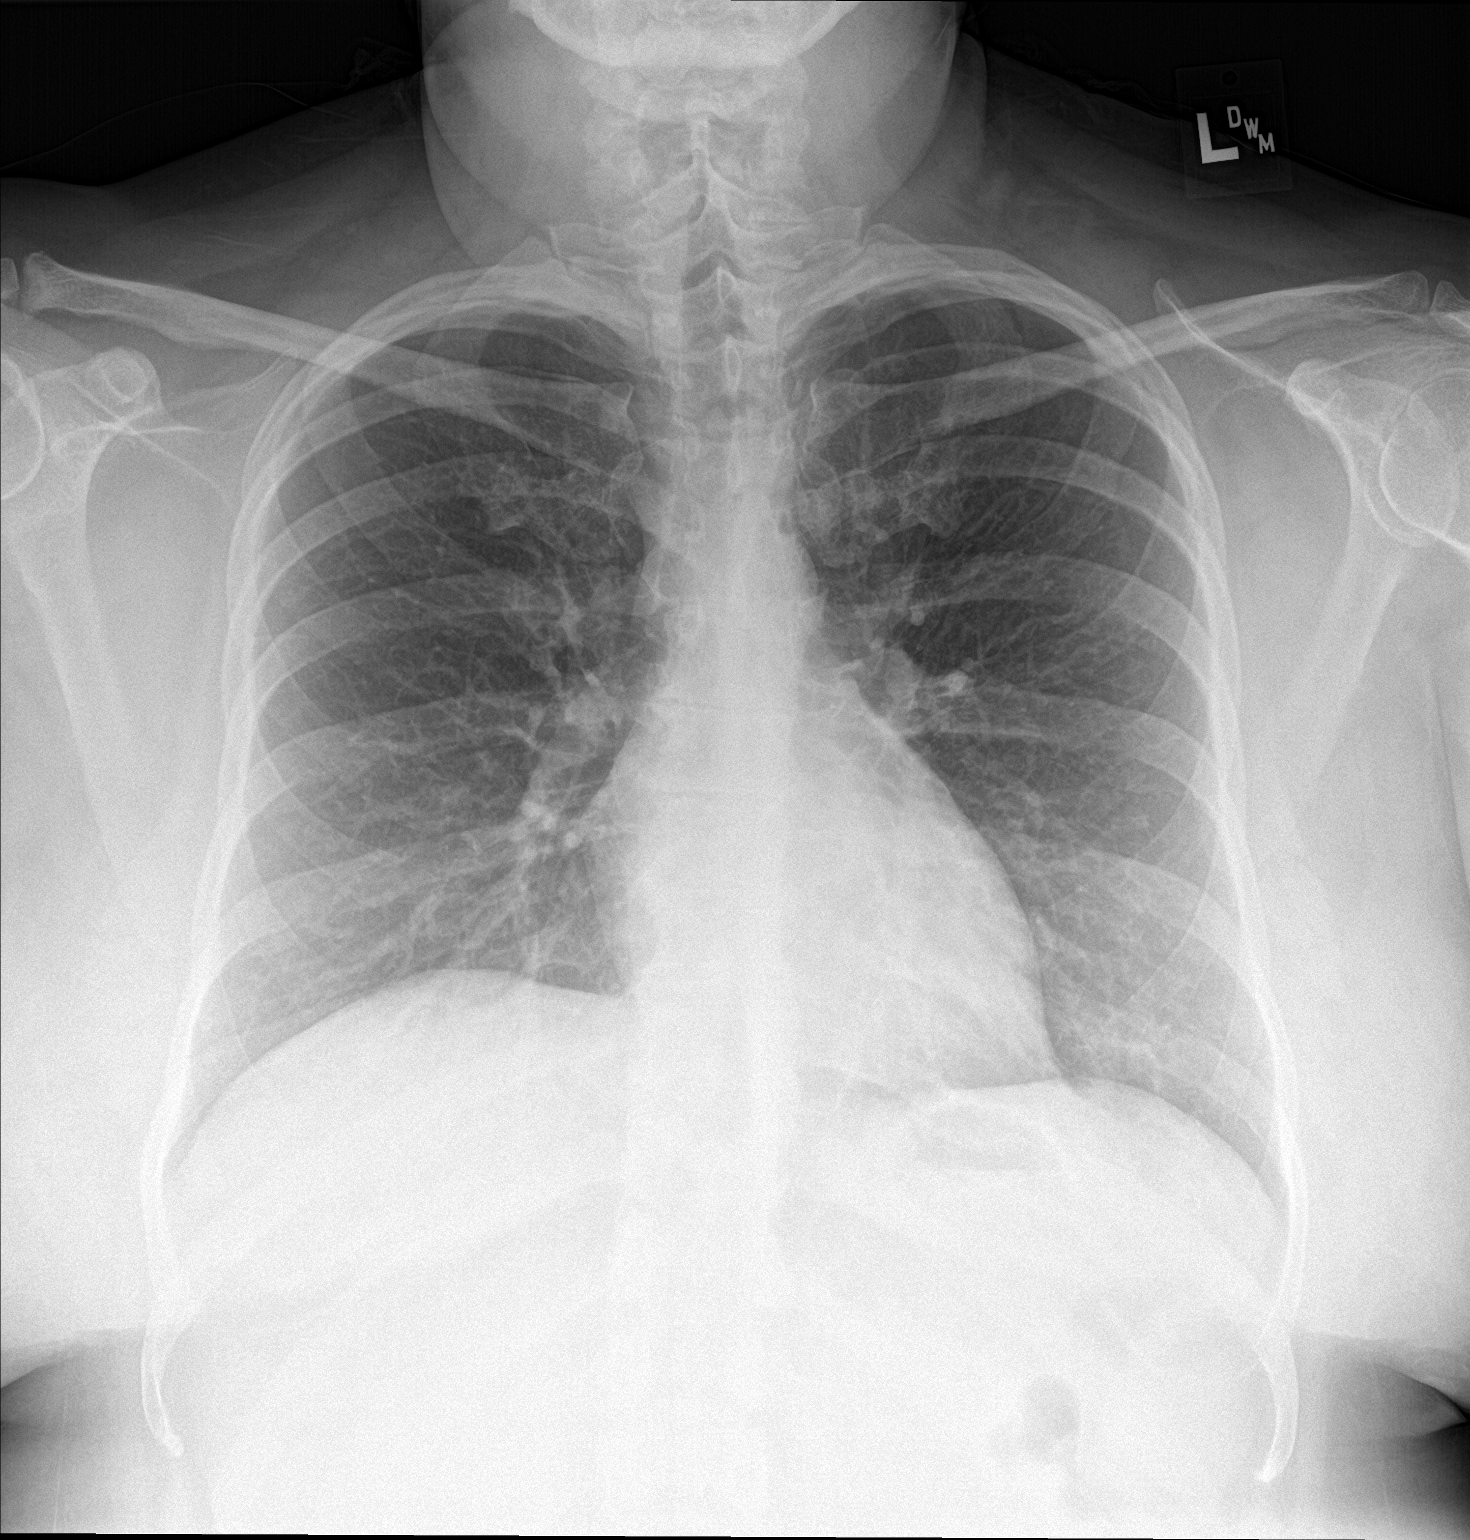

[chest lat]
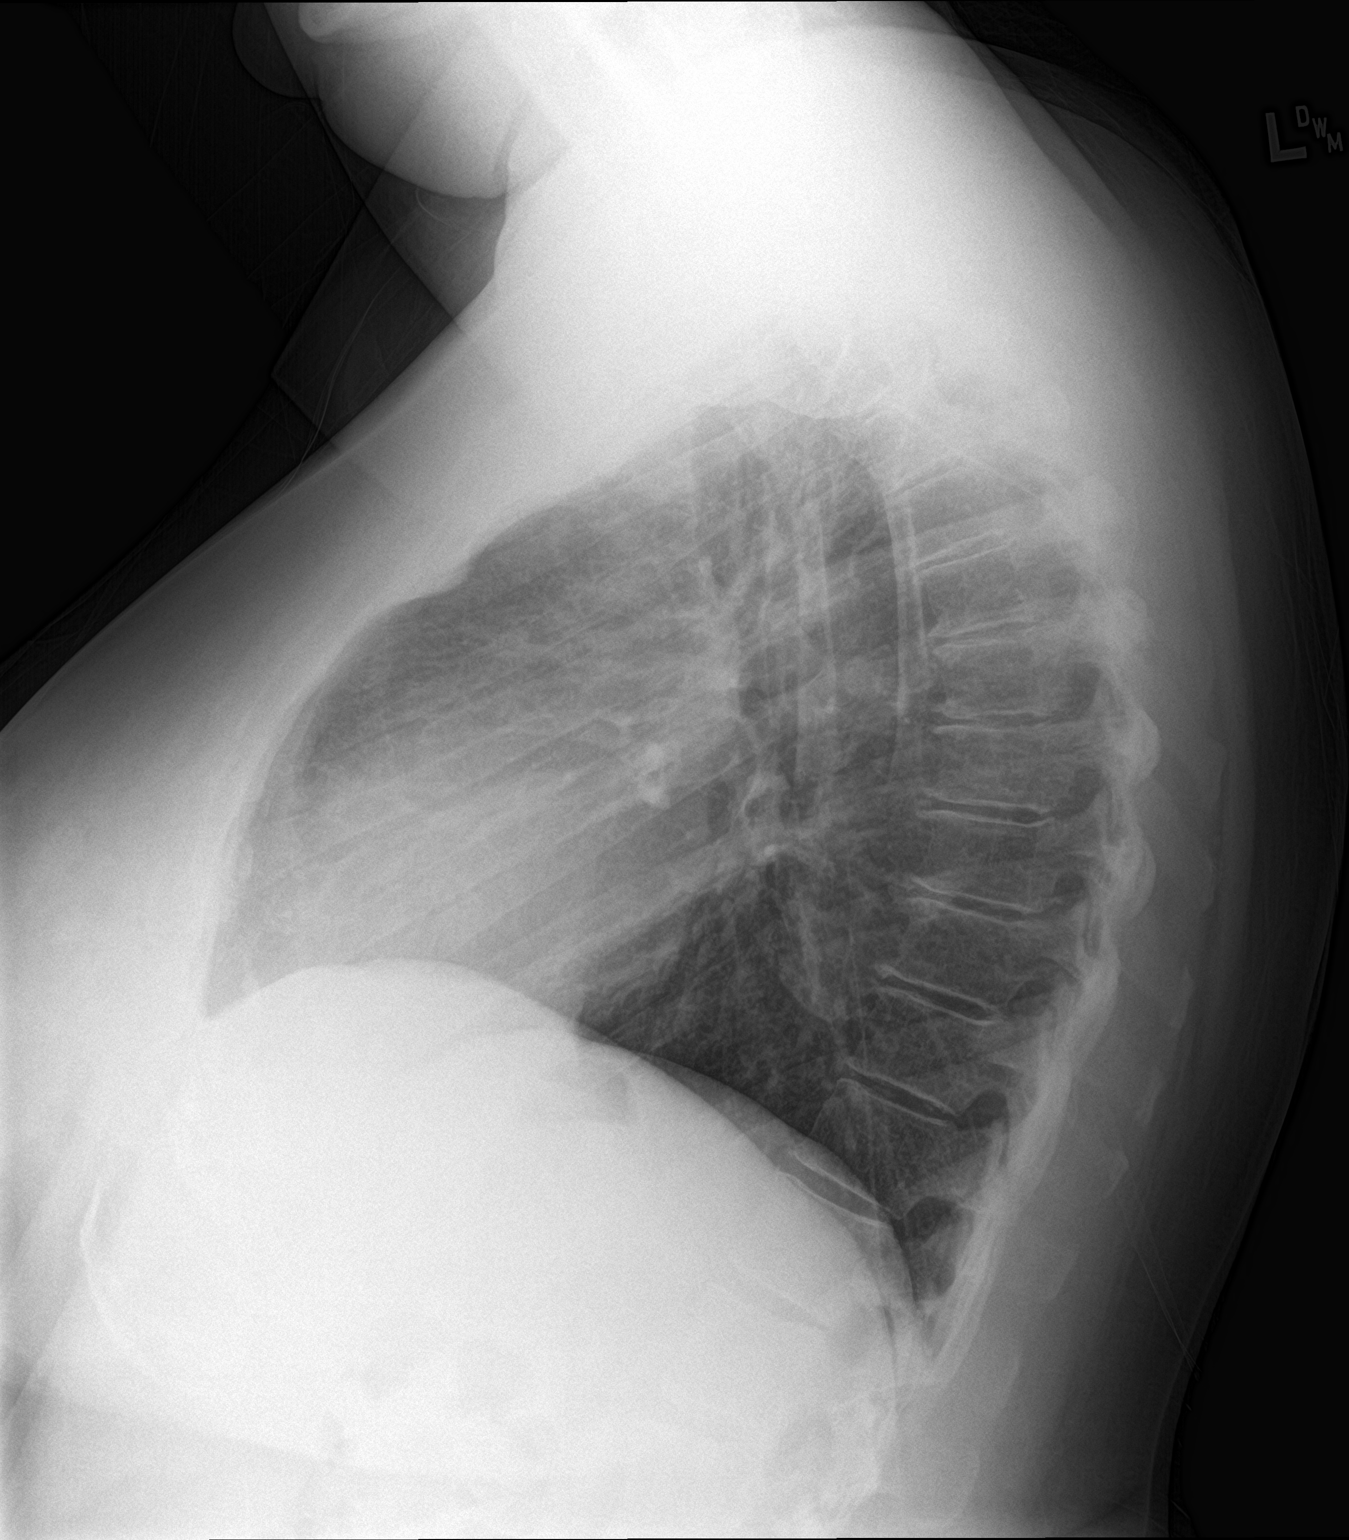

[2 of 2 positions shown; findings below may reference images not displayed]

FINDINGS: Lungs are clear. Heart size is normal. No pneumothorax or pleural
effusion. No acute bony abnormality.
IMPRESSION: Negative chest.

## 2021-01-21 ENCOUNTER — Emergency Department (INDEPENDENT_AMBULATORY_CARE_PROVIDER_SITE_OTHER)
Admission: RE | Admit: 2021-01-21 | Discharge: 2021-01-21 | Disposition: A | Discharge status: Home / Self Care | Payer: BC Managed Care – PPO | Source: Ambulatory Visit | Attending: Family Medicine | Admitting: Family Medicine

## 2021-01-21 ENCOUNTER — Other Ambulatory Visit: Payer: Self-pay

## 2021-01-21 VITALS — BP 115/74 | HR 75 | Temp 98.3°F | Resp 18

## 2021-01-21 DIAGNOSIS — H8112 Benign paroxysmal vertigo, left ear: Secondary | ICD-10-CM | POA: Diagnosis not present

## 2021-01-21 DIAGNOSIS — Z9109 Other allergy status, other than to drugs and biological substances: Secondary | ICD-10-CM

## 2021-01-21 MED ORDER — PREDNISONE 20 MG PO TABS: 20.0000 mg | ORAL_TABLET | Freq: Two times a day (BID) | ORAL | 0 refills | Status: DC

## 2021-01-21 MED ORDER — LEVOCETIRIZINE DIHYDROCHLORIDE 5 MG PO TABS: 10.0000 mg | ORAL_TABLET | Freq: Every evening | ORAL | 0 refills | Status: AC

## 2021-01-21 MED ORDER — DIAZEPAM 5 MG PO TABS: 2.5000 mg | ORAL_TABLET | Freq: Three times a day (TID) | ORAL | 0 refills | Status: DC | PRN

## 2021-01-21 NOTE — ED Provider Notes (Signed)
[name]    CSN: [id number] Arrival date & time: 01/21/21  1343      History   Chief Complaint Chief Complaint  Patient presents with   Dizziness   Ear Fullness    HPI Loretta Joseph is a 45 y.o. female.   HPI  Patient is here for an allergic reaction.  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  She states that a floral arrangement was delivered to her house last week with a golden rod in it.  Even though she recognized the flower imported outside, she developed sinus congestion, postnasal drip, ear pressure and pain, and vertigo. She has a long history of allergies.  She used to be under the care of an allergy specialist.  Every day she takes cetirizine 20 mg at bedtime, loratadine 10 mg in the morning, and diphenhydramine 25 mg at bedtime.  She states that Flonase has not worked for her.  She has been on these antihistamines for so long she does not feel like they work for her anymore.  She does not have a local allergy specialist and is advised to get 1 She has had vertigo in the past.  She states it was 20 years ago.  She states that she does remember what she was prescribed.  She did go to the pharmacy and get some Dramamine.  The Dramamine is not helping her.  She has had waves of nausea but no vomiting.  She states that she was confined to bed for the day because of her severe dizziness. Patient is terribly stressed right now.  Her 79 year old son died of COVID last week.  He had cerebral palsy.   Past Medical History:  Diagnosis Date   Abrasion of foot, right 06/15/2017   Environmental allergies    Exercise-induced asthma    prn inhaler   History of MRSA infection 06/2016   axilla   Hypothyroidism    Macromastia 05/2017   Runny nose 06/15/2017   clear drainage, per pt.   Sinus headache     There are no problems to display for this patient.   Past Surgical History:  Procedure Laterality Date   ANTERIOR AND POSTERIOR REPAIR  2017   BREAST  REDUCTION SURGERY Bilateral 06/22/2017   Procedure: BILATERAL MAMMARY REDUCTION  (BREAST);  Surgeon: Contogiannis, Audrea Muscat, MD;  Location: [facility];  Service: Plastics;  Laterality: Bilateral;   ENDOMETRIAL ABLATION W/ NOVASURE     FINGER GANGLION CYST EXCISION Left    index finger   INCONTINENCE SURGERY  2017   TUBAL LIGATION      OB History   No obstetric history on file.      Home Medications    Prior to Admission medications   Medication Sig Start Date End Date Taking? Authorizing Provider  diazepam (VALIUM) 5 MG tablet Take 0.5-1 tablets (2.5-5 mg total) by mouth every 8 (eight) hours as needed (dizziness). 01/21/21  Yes Raylene Everts, MD  levocetirizine (XYZAL) 5 MG tablet Take 2 tablets (10 mg total) by mouth every evening. 01/21/21  Yes Raylene Everts, MD  predniSONE (DELTASONE) 20 MG tablet Take 1 tablet (20 mg total) by mouth 2 (two) times daily with a meal. 01/21/21  Yes Raylene Everts, MD  Rimegepant Sulfate (NURTEC) 75 MG TBDP TAKE 1 TABLET (75 MG TOTAL) BY MOUTH ONCE AS NEEDED FOR UP TO 1 DOSE FOR MIGRAINE. 07/26/20  Yes [provider]  SUMAtriptan (IMITREX) 100 MG tablet Take  1 tablet by mouth as needed. 01/23/19  Yes [provider]  topiramate (TOPAMAX) 25 MG tablet TAKE 1 TABLET BY MOUTH EVERY DAY AT NIGHT 06/23/19  Yes [provider]  albuterol (PROVENTIL) (2.5 MG/3ML) 0.083% nebulizer solution Take 3 mLs (2.5 mg total) by nebulization every 6 (six) hours as needed for wheezing or shortness of breath. 07/10/17   Fransico Meadow, PA-C  cetirizine (ZYRTEC) 10 MG tablet Take 20 mg by mouth at bedtime.    [provider]  cholecalciferol (VITAMIN D) 1000 units tablet Take 1,000 Units by mouth daily.    [provider]  levothyroxine (SYNTHROID, LEVOTHROID) 125 MCG tablet Take 25 mcg by mouth daily before breakfast.     [provider]  loratadine (CLARITIN) 10 MG tablet Take 10 mg by mouth daily.     [provider]  Multiple Vitamin (MULTIVITAMIN) tablet Take 1 tablet by mouth daily.    [provider]  Pregnenolone Micronized POWD Take 75 mg by mouth.     [provider]  Probiotic Product (PROBIOTIC DAILY PO) Take by mouth.    [provider]  progesterone (PROMETRIUM) 100 MG capsule Take 100 mg by mouth daily.    [provider]  thyroid (ARMOUR) 120 MG tablet Take 75 mg by mouth daily before breakfast.     [provider]  vitamin C (ASCORBIC ACID) 500 MG tablet Take 500 mg by mouth daily.    [provider]    Family History History reviewed. No pertinent family history.  Social History Social History   Tobacco Use   Smoking status: Never   Smokeless tobacco: Never  Vaping Use   Vaping Use: Never used  Substance Use Topics   Alcohol use: Yes    Comment: infrequently   Drug use: No     Allergies   Amoxicillin, Norco [hydrocodone-acetaminophen], and Adhesive [tape]   Review of Systems Review of Systems  See HPI Physical Exam Triage Vital Signs ED Triage Vitals  Enc Vitals Group     BP 01/21/21 1406 115/74     Pulse Rate 01/21/21 1406 75     Resp 01/21/21 1406 18     Temp 01/21/21 1406 98.3 F (36.8 C)     Temp src --      SpO2 01/21/21 1406 97 %     Weight --      Height --      Head Circumference --      Peak Flow --      Pain Score 01/21/21 1400 3     Pain Loc --      Pain Edu? --      Excl. in [place]? --    No data found.  Updated Vital Signs BP 115/74   Pulse 75   Temp 98.3 F (36.8 C)   Resp 18   SpO2 97%       Physical Exam Constitutional:      General: She is not in acute distress.    Appearance: She is well-developed.     Comments: Guarded movements  HENT:     Head: Normocephalic and atraumatic.     Right Ear: Tympanic membrane, ear canal and external ear normal.     Left Ear: Tympanic membrane, ear canal and external ear normal.     Nose: Congestion (Turbinates red  and swollen) and rhinorrhea present.     Mouth/Throat:     Mouth: Mucous membranes are moist.  Pharynx: Oropharynx is clear.  Eyes:     Conjunctiva/sclera: Conjunctivae normal.     Pupils: Pupils are equal, round, and reactive to light.  Cardiovascular:     Rate and Rhythm: Normal rate and regular rhythm.     Comments: Positive click Pulmonary:     Effort: Pulmonary effort is normal. No respiratory distress.  Abdominal:     General: There is no distension.     Palpations: Abdomen is soft.  Musculoskeletal:        General: Normal range of motion.     Cervical back: Normal range of motion.  Lymphadenopathy:     Cervical: No cervical adenopathy.  Skin:    General: Skin is warm and dry.  Neurological:     General: No focal deficit present.     Mental Status: She is alert.  Psychiatric:        Mood and Affect: Mood normal.        Behavior: Behavior normal.     UC Treatments / Results  Labs (all labs ordered are listed, but only abnormal results are displayed) Labs Reviewed - No data to display  EKG   Radiology No results found.  Procedures Procedures (including critical care time)  Medications Ordered in UC Medications - No data to display  Initial Impression / Assessment and Plan / UC Course  I have reviewed the triage vital signs and the nursing notes.  Pertinent labs & imaging results that were available during my care of the patient were reviewed by me and considered in my medical decision making (see chart for details).    I advised the patient to stop taking 3 antihistamines a day, initially reduced to 2.  She will take Xyzal and Benadryl.  I do not have confidence that adding another antihistamine will help with her vertigo, and I am not going to prescribe Antivert.  I will give her some diazepam hoping this will help her symptoms.  For the allergic flare symptoms I am going to give her 5 days of prednisone.  I discussed with her the importance of seeing a  primary care doctor. Final diagnoses:  Benign paroxysmal positional vertigo of left ear  Multiple environmental allergies     Discharge Instructions      I am changing Zyrtec to Xyzal.  Stop Zyrtec entirely and take 2 Xyzal at bedtime Stop loratadine (Claritin) entirely May continue Benadryl at bedtime Take prednisone 2 times a day Take diazepam as needed for dizziness.  Caution drowsiness. Make sure you are drinking lots of water Follow-up with a primary care doctor   ED Prescriptions     Medication Sig Dispense Auth. Provider   predniSONE (DELTASONE) 20 MG tablet Take 1 tablet (20 mg total) by mouth 2 (two) times daily with a meal. 10 tablet Raylene Everts, MD   diazepam (VALIUM) 5 MG tablet Take 0.5-1 tablets (2.5-5 mg total) by mouth every 8 (eight) hours as needed (dizziness). 20 tablet Raylene Everts, MD   levocetirizine (XYZAL) 5 MG tablet Take 2 tablets (10 mg total) by mouth every evening. 60 tablet Raylene Everts, MD      I have reviewed the PDMP during this encounter.   Raylene Everts, MD 01/21/21 [phone]

## 2021-01-21 NOTE — Discharge Instructions (Addendum)
I am changing Zyrtec to Xyzal.  Stop Zyrtec entirely and take 2 Xyzal at bedtime Stop loratadine (Claritin) entirely May continue Benadryl at bedtime Take prednisone 2 times a day Take diazepam as needed for dizziness.  Caution drowsiness. Make sure you are drinking lots of water Follow-up with a primary care doctor

## 2021-01-21 NOTE — ED Triage Notes (Signed)
Pt is present today with bilateral ear fullness, dizziness, and sinus headache. Pt states that she noticed these sx after some flowers were delivered to her house last week. Pt states that she tried OTC medication and nothing has helped.

## 2021-06-05 ENCOUNTER — Emergency Department (INDEPENDENT_AMBULATORY_CARE_PROVIDER_SITE_OTHER)
Admission: EM | Admit: 2021-06-05 | Discharge: 2021-06-05 | Disposition: A | Discharge status: Home / Self Care | Payer: BC Managed Care – PPO | Source: Home / Self Care | Attending: Family Medicine | Admitting: Family Medicine

## 2021-06-05 ENCOUNTER — Other Ambulatory Visit: Payer: Self-pay

## 2021-06-05 ENCOUNTER — Emergency Department: Admit: 2021-06-05 | Discharge status: Absent without leave | Payer: Self-pay

## 2021-06-05 DIAGNOSIS — J069 Acute upper respiratory infection, unspecified: Secondary | ICD-10-CM

## 2021-06-05 LAB — POC INFLUENZA A AND B ANTIGEN (URGENT CARE ONLY)
Influenza A Ag: NEGATIVE
Influenza B Ag: NEGATIVE

## 2021-06-05 LAB — POC SARS CORONAVIRUS 2 AG -  ED: SARS Coronavirus 2 Ag: NEGATIVE

## 2021-06-05 NOTE — Discharge Instructions (Addendum)
May use over-the-counter cough and cold medicine Drink lots of fluid Run a humidifier if 1 is available Call for problems  I have written a work excuse in case you need 1 for Saturday

## 2021-06-05 NOTE — ED Triage Notes (Signed)
Pt presents with cough and congestion that began last night, pt exposed to the flu last week

## 2021-06-09 NOTE — ED Provider Notes (Signed)
[facility]    CSN: [id number] Arrival date & time: 06/05/21  1216      History   Chief Complaint Chief Complaint  Patient presents with   Cough   Nasal Congestion    HPI Loretta Joseph is a 45 y.o. female.   HPI  Patient is here for an upper respiratory infection.  She has nasal congestion and cough.  She has been feeling sick for the last few days.  No known exposure to flu or COVID.  Past Medical History:  Diagnosis Date   Abrasion of foot, right 06/15/2017   Environmental allergies    Exercise-induced asthma    prn inhaler   History of MRSA infection 06/2016   axilla   Hypothyroidism    Macromastia 05/2017   Runny nose 06/15/2017   clear drainage, per pt.   Sinus headache     There are no problems to display for this patient.   Past Surgical History:  Procedure Laterality Date   ANTERIOR AND POSTERIOR REPAIR  2017   BREAST REDUCTION SURGERY Bilateral 06/22/2017   Procedure: BILATERAL MAMMARY REDUCTION  (BREAST);  Surgeon: Contogiannis, Audrea Muscat, MD;  Location: [facility];  Service: Plastics;  Laterality: Bilateral;   ENDOMETRIAL ABLATION W/ NOVASURE     FINGER GANGLION CYST EXCISION Left    index finger   INCONTINENCE SURGERY  2017   TUBAL LIGATION      OB History   No obstetric history on file.      Home Medications    Prior to Admission medications   Medication Sig Start Date End Date Taking? Authorizing Provider  Cyanocobalamin (VITAMIN B-12 IJ) Inject as directed.   Yes [provider]  DHEA 50 MG CAPS Take by mouth.   Yes [provider]  albuterol (PROVENTIL) (2.5 MG/3ML) 0.083% nebulizer solution Take 3 mLs (2.5 mg total) by nebulization every 6 (six) hours as needed for wheezing or shortness of breath. 07/10/17   Fransico Meadow, PA-C  cholecalciferol (VITAMIN D) 1000 units tablet Take 1,000 Units by mouth daily.    [provider]  levocetirizine (XYZAL) 5 MG tablet Take 2 tablets (10  mg total) by mouth every evening. 01/21/21   Raylene Everts, MD  levothyroxine (SYNTHROID, LEVOTHROID) 125 MCG tablet Take 25 mcg by mouth daily before breakfast.     [provider]  Multiple Vitamin (MULTIVITAMIN) tablet Take 1 tablet by mouth daily.    [provider]  Pregnenolone Micronized POWD Take 75 mg by mouth.     [provider]  Probiotic Product (PROBIOTIC DAILY PO) Take by mouth.    [provider]  progesterone (PROMETRIUM) 100 MG capsule Take 100 mg by mouth daily.    [provider]  Rimegepant Sulfate (NURTEC) 75 MG TBDP TAKE 1 TABLET (75 MG TOTAL) BY MOUTH ONCE AS NEEDED FOR UP TO 1 DOSE FOR MIGRAINE. 07/26/20   [provider]  SUMAtriptan (IMITREX) 100 MG tablet Take 1 tablet by mouth as needed. Patient not taking: Reported on 06/05/2021 01/23/19   [provider]  thyroid (ARMOUR) 120 MG tablet Take 75 mg by mouth daily before breakfast.     [provider]  topiramate (TOPAMAX) 25 MG tablet TAKE 1 TABLET BY MOUTH EVERY DAY AT NIGHT 06/23/19   [provider]  vitamin C (ASCORBIC ACID) 500 MG tablet Take 500 mg by mouth daily.    [provider]    Family History History reviewed. No  pertinent family history.  Social History Social History   Tobacco Use   Smoking status: Never   Smokeless tobacco: Never  Vaping Use   Vaping Use: Never used  Substance Use Topics   Alcohol use: Yes    Comment: infrequently   Drug use: No     Allergies   Amoxicillin, Norco [hydrocodone-acetaminophen], and Adhesive [tape]   Review of Systems Review of Systems See HPI  Physical Exam Triage Vital Signs ED Triage Vitals  Enc Vitals Group     BP 06/05/21 1228 112/75     Pulse Rate 06/05/21 1228 83     Resp 06/05/21 1228 14     Temp 06/05/21 1228 99.1 F (37.3 C)     Temp Source 06/05/21 1228 Oral     SpO2 06/05/21 1228 97 %     Weight --      Height --      Head Circumference --       Peak Flow --      Pain Score 06/05/21 1229 0     Pain Loc --      Pain Edu? --      Excl. in [place]? --    No data found.  Updated Vital Signs BP 112/75 (BP Location: Right Arm)   Pulse 83   Temp 99.1 F (37.3 C) (Oral)   Resp 14   SpO2 97%      Physical Exam Constitutional:      General: She is not in acute distress.    Appearance: She is well-developed. She is ill-appearing.  HENT:     Head: Normocephalic and atraumatic.     Right Ear: Tympanic membrane and ear canal normal.     Left Ear: Tympanic membrane and ear canal normal.     Nose: Congestion present.     Mouth/Throat:     Pharynx: Posterior oropharyngeal erythema present.  Eyes:     Conjunctiva/sclera: Conjunctivae normal.     Pupils: Pupils are equal, round, and reactive to light.  Cardiovascular:     Rate and Rhythm: Normal rate and regular rhythm.     Heart sounds: Normal heart sounds.  Pulmonary:     Effort: Pulmonary effort is normal. No respiratory distress.     Breath sounds: Normal breath sounds.  Abdominal:     General: There is no distension.     Palpations: Abdomen is soft.  Musculoskeletal:        General: Normal range of motion.     Cervical back: Normal range of motion.  Skin:    General: Skin is warm and dry.  Neurological:     Mental Status: She is alert.     UC Treatments / Results  Labs (all labs ordered are listed, but only abnormal results are displayed) Labs Reviewed  POC INFLUENZA A AND B ANTIGEN (URGENT CARE ONLY)  POC SARS CORONAVIRUS 2 AG -  ED    EKG   Radiology No results found.  Procedures Procedures (including critical care time)  Medications Ordered in UC Medications - No data to display  Initial Impression / Assessment and Plan / UC Course  I have reviewed the triage vital signs and the nursing notes.  Pertinent labs & imaging results that were available during my care of the patient were reviewed by me and considered in my medical decision making (see  chart for details).     In-house testing negative.  Patient has a flulike illness.  Symptomatic cares reviewed Final Clinical  Impressions(s) / UC Diagnoses   Final diagnoses:  Acute upper respiratory infection     Discharge Instructions      May use over-the-counter cough and cold medicine Drink lots of fluid Run a humidifier if 1 is available Call for problems  I have written a work excuse in case you need 1 for Saturday   ED Prescriptions   None    PDMP not reviewed this encounter.   Raylene Everts, MD 06/09/21 2059
